# Patient Record
Sex: Female | Born: 1995 | Race: White | Hispanic: No | Marital: Single | Smoking: Never smoker
Health system: Southern US, Community
[De-identification: ages and names within clinical notes are randomized; demographics above are authoritative.]

---

## 2021-08-07 ENCOUNTER — Emergency Department (HOSPITAL_BASED_OUTPATIENT_CLINIC_OR_DEPARTMENT_OTHER)
Admission: EM | Admit: 2021-08-07 | Discharge: 2021-08-07 | Disposition: A | Discharge status: Home / Self Care | Payer: 59 | Attending: Emergency Medicine | Admitting: Emergency Medicine

## 2021-08-07 ENCOUNTER — Encounter (HOSPITAL_BASED_OUTPATIENT_CLINIC_OR_DEPARTMENT_OTHER): Payer: Self-pay

## 2021-08-07 ENCOUNTER — Emergency Department (HOSPITAL_BASED_OUTPATIENT_CLINIC_OR_DEPARTMENT_OTHER): Payer: 59 | Admitting: Radiology

## 2021-08-07 ENCOUNTER — Other Ambulatory Visit: Payer: Self-pay

## 2021-08-07 DIAGNOSIS — S93602A Unspecified sprain of left foot, initial encounter: Secondary | ICD-10-CM | POA: Diagnosis not present

## 2021-08-07 DIAGNOSIS — X501XXA Overexertion from prolonged static or awkward postures, initial encounter: Secondary | ICD-10-CM | POA: Insufficient documentation

## 2021-08-07 DIAGNOSIS — Z91040 Latex allergy status: Secondary | ICD-10-CM | POA: Diagnosis not present

## 2021-08-07 DIAGNOSIS — S99922A Unspecified injury of left foot, initial encounter: Secondary | ICD-10-CM | POA: Diagnosis present

## 2021-08-07 MED ORDER — OXYCODONE HCL 5 MG PO TABS: 5.0000 mg | ORAL_TABLET | Freq: Four times a day (QID) | ORAL | 0 refills | Status: AC | PRN

## 2021-08-07 MED ORDER — OXYCODONE-ACETAMINOPHEN 5-325 MG PO TABS
1.0000 | ORAL_TABLET | Freq: Once | ORAL | Status: AC
  Administered 2021-08-07: 1 via ORAL
  Filled 2021-08-07: qty 1

## 2021-08-07 NOTE — ED Provider Notes (Signed)
?[facility] ?Provider Note ? ? ?CSN: [id number] ?Arrival date & time: 08/07/21  2102 ? ?  ? ?History ? ?Chief Complaint  ?Patient presents with  ? Foot Pain  ? ? ?Shirley Rich is a 26 y.o. female. ? ?HPI ?26 year old female presents with severe left foot pain.  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  She thinks her foot planted wrong and she twisted it and fell.  Pain is primarily in her midfoot.  She is able to bear weight on the medial aspect of her foot only but is very painful.  She took ibuprofen, ketorolac, gabapentin and Tylenol with no relief. ? ?Home Medications ?Prior to Admission medications   ?Medication Sig Start Date End Date Taking? Authorizing Provider  ?oxyCODONE (ROXICODONE) 5 MG immediate release tablet Take 1 tablet (5 mg total) by mouth every 6 (six) hours as needed for severe pain. 08/07/21  Yes Pricilla Loveless, MD  ?   ? ?Allergies    ?Buspirone, Hydrocodone-acetaminophen, Latex, Hydrocodone, and Tape   ? ?Review of Systems   ?Review of Systems  ?Musculoskeletal:  Positive for arthralgias.  ? ?Physical Exam ?Updated Vital Signs ?BP 136/76 (BP Location: Right Arm)   Pulse 90   Temp 98.1 ?F (36.7 ?C) (Oral)   Resp 16   Ht 5\' 7"  (1.702 m)   Wt 99.8 kg   LMP 07/30/2021 (Approximate)   SpO2 99%   BMI 34.46 kg/m?  ?Physical Exam ?Vitals and nursing note reviewed.  ?Constitutional:   ?   Appearance: She is well-developed.  ?HENT:  ?   Head: Normocephalic and atraumatic.  ?Cardiovascular:  ?   Rate and Rhythm: Normal rate and regular rhythm.  ?   Pulses:     ?     Dorsalis pedis pulses are 2+ on the left side.  ?Pulmonary:  ?   Effort: Pulmonary effort is normal.  ?Abdominal:  ?   General: There is no distension.  ?Musculoskeletal:  ?   Left lower leg: No tenderness.  ?   Left ankle: No tenderness.  ?   Left Achilles Tendon: No defects.  ?   Left foot: Tenderness  (primarily to the mid and distal dorsal foot) present. No swelling.  ?Skin: ?   General: Skin is warm and dry.  ?Neurological:  ?   Mental Status: She is alert.  ? ? ?ED Results / Procedures / Treatments   ?Labs ?(all labs ordered are listed, but only abnormal results are displayed) ?Labs Reviewed - No data to display ? ?EKG ?None ? ?Radiology ?DG Foot Complete Left ? ?Result Date: 08/07/2021 ?CLINICAL DATA:  Left foot pain, rolled ankle EXAM: LEFT FOOT - COMPLETE 3+ VIEW COMPARISON:  None Available. FINDINGS: There is no evidence of fracture or dislocation. There is no evidence of arthropathy or other focal bone abnormality. Soft tissues are unremarkable. IMPRESSION: Negative. Electronically Signed   By: 10/07/2021 M.D.   On: 08/07/2021 21:35   ? ?Procedures ?Procedures  ? ? ?Medications Ordered in ED ?Medications  ?oxyCODONE-acetaminophen (PERCOCET/ROXICET) 5-325 MG per tablet 1 tablet (1 tablet Oral Given 08/07/21 2244)  ? ? ?ED Course/ Medical Decision Making/ A&P ?  ?                        ?Medical Decision Making ?Amount and/or Complexity of Data Reviewed ?Radiology: ordered and independent interpretation performed. ? ?  Risk ?Prescription drug management. ? ? ?X-ray images viewed by myself, no fracture or dislocation.  Neurovascular intact on exam.  Given her difficulty walking, will give a short course of pain medicine.  Narcotic database reviewed.  Otherwise, there is no real ankle tenderness to suggest needing ankle x-ray.  Will discharge with return precautions. ? ? ? ? ? ? ? ?Final Clinical Impression(s) / ED Diagnoses ?Final diagnoses:  ?Foot sprain, left, initial encounter  ? ? ?Rx / DC Orders ?ED Discharge Orders   ? ?      Ordered  ?  oxyCODONE (ROXICODONE) 5 MG immediate release tablet  Every 6 hours PRN       ? 08/07/21 2248  ? ?  ?  ? ?  ? ? ?  ?Pricilla Loveless, MD ?08/07/21 2323 ? ?

## 2021-08-07 NOTE — ED Triage Notes (Signed)
Left foot pain after rolling her ankle. Says she has minimum sensation at baseline and frequently trips. Unable to bare any weight afterwards.  ?

## 2022-06-08 IMAGING — DX DG FOOT COMPLETE 3+V*L*
3 series · 3 of 3 positions shown · non-contrast
Comparison: None Available.

CLINICAL DATA: Left foot pain, rolled ankle

EXAM:
LEFT FOOT - COMPLETE 3+ VIEW

[foot ap]
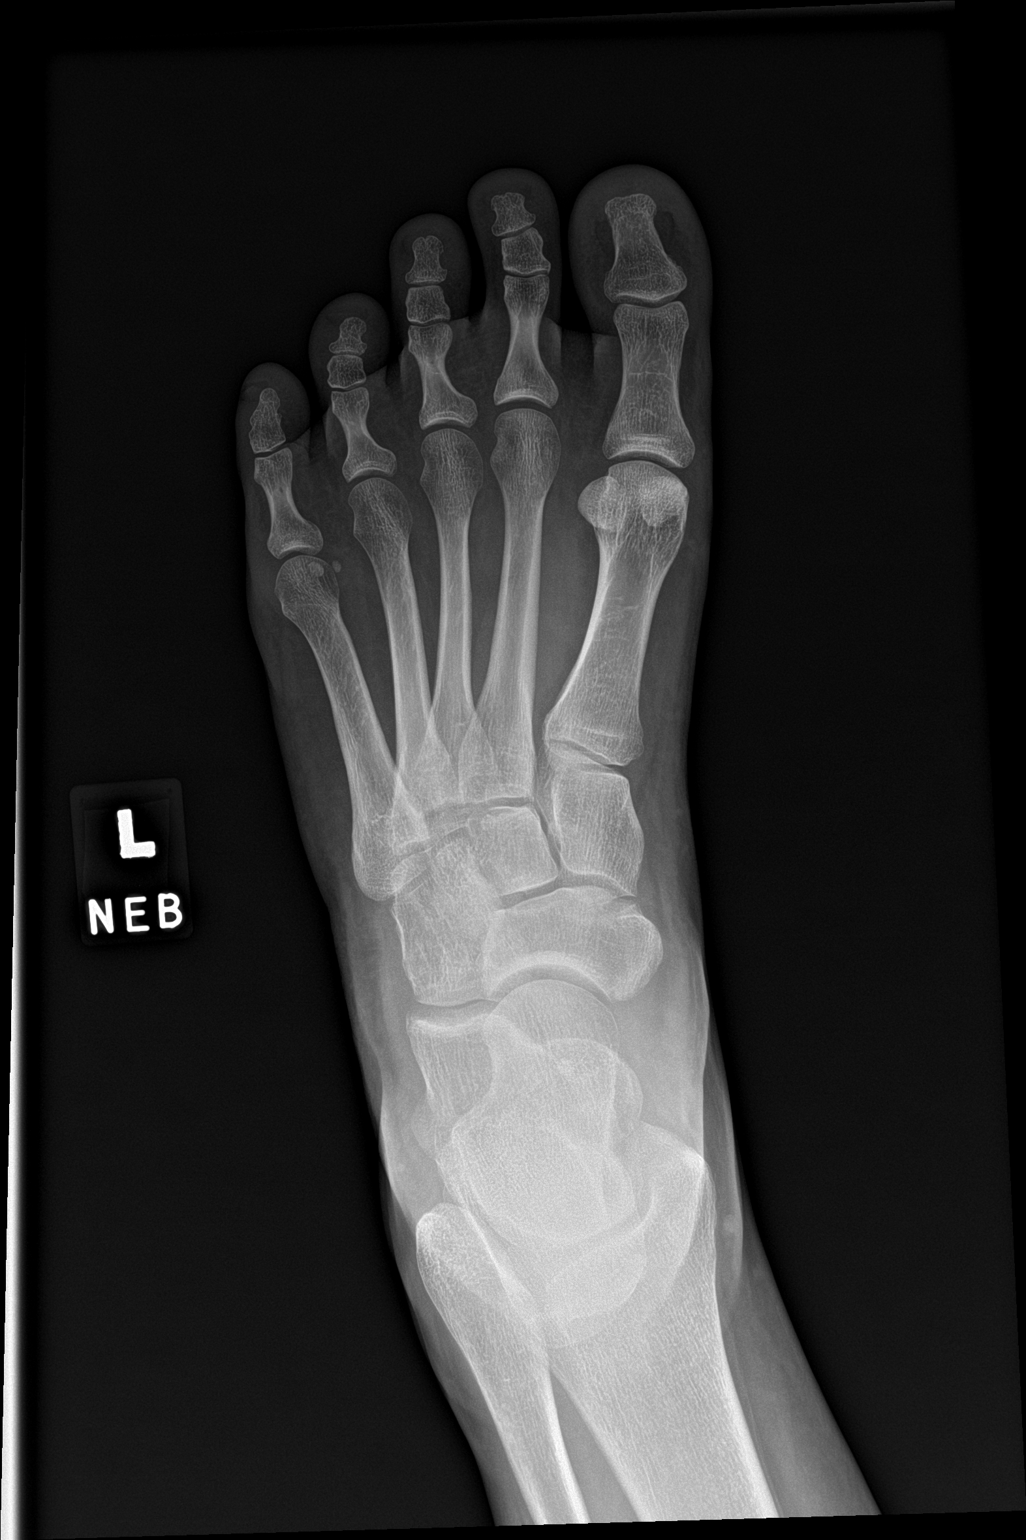

[foot obl]
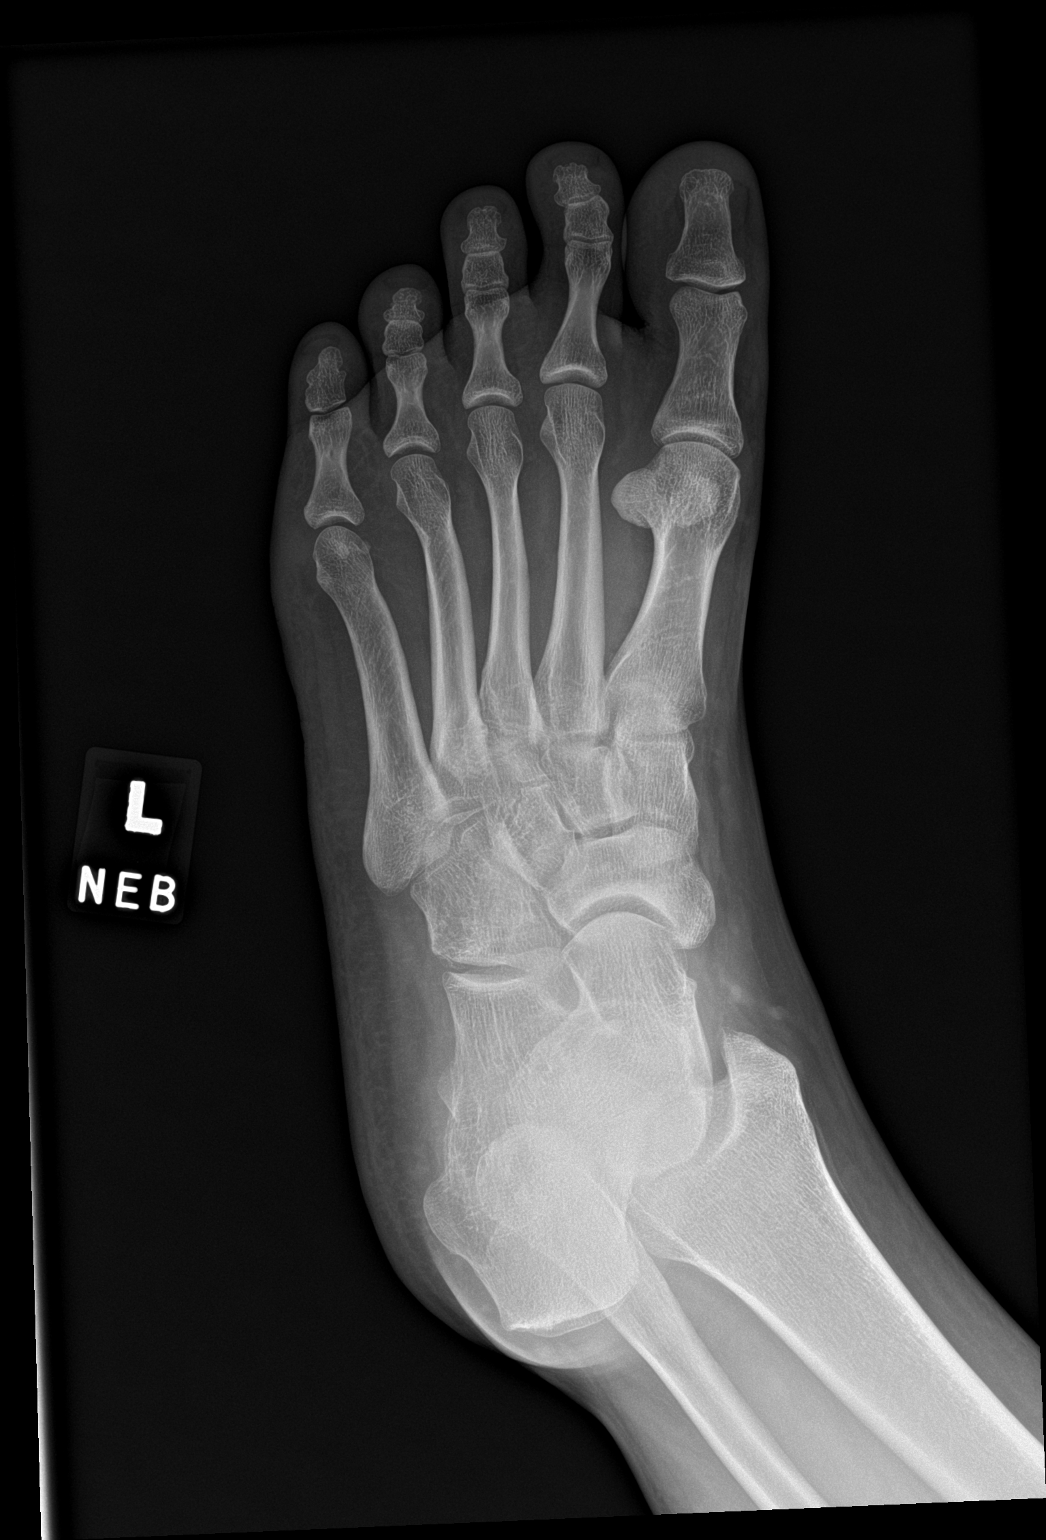

[foot lat]
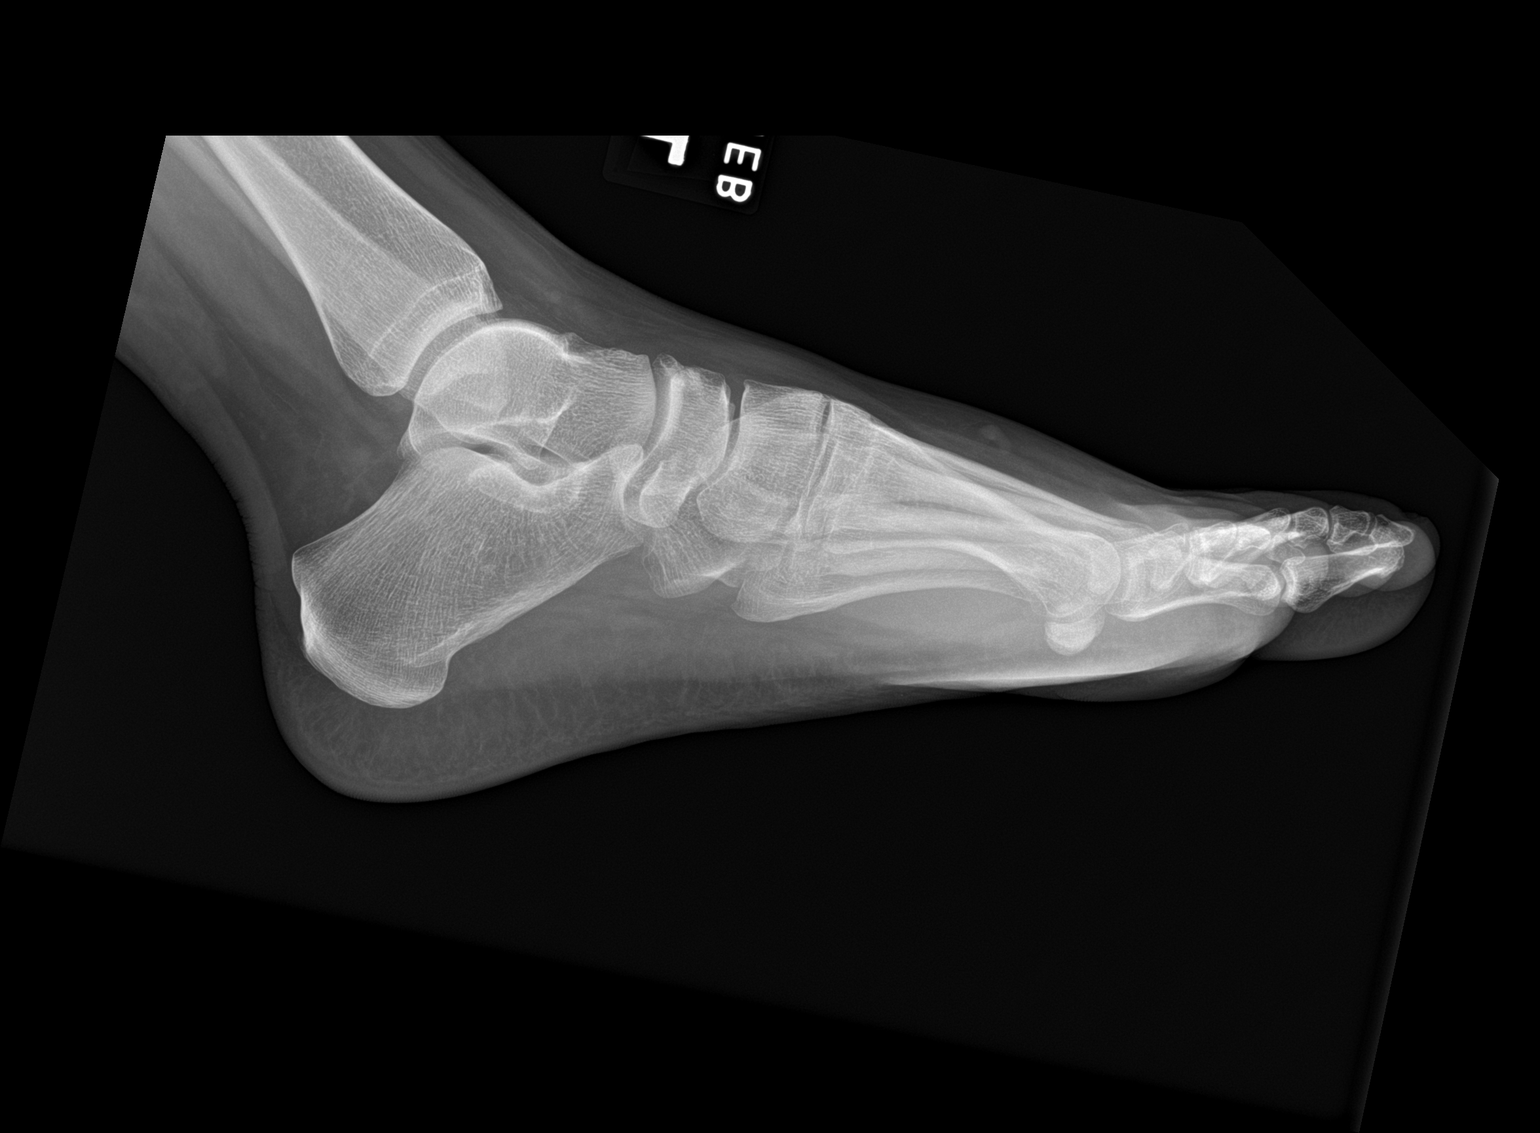

[3 of 3 positions shown; findings below may reference images not displayed]

FINDINGS: There is no evidence of fracture or dislocation. There is no
evidence of arthropathy or other focal bone abnormality. Soft
tissues are unremarkable.
IMPRESSION: Negative.
